# Patient Record
Sex: Male | Born: 1960 | Race: White | Hispanic: No | Marital: Married | Smoking: Former smoker
Health system: Southern US, Community
[De-identification: ages and names within clinical notes are randomized; demographics above are authoritative.]

## PROBLEM LIST (undated history)

## (undated) DIAGNOSIS — Z9289 Personal history of other medical treatment: Secondary | ICD-10-CM

## (undated) DIAGNOSIS — E785 Hyperlipidemia, unspecified: Secondary | ICD-10-CM

## (undated) DIAGNOSIS — E669 Obesity, unspecified: Secondary | ICD-10-CM

## (undated) DIAGNOSIS — I2510 Atherosclerotic heart disease of native coronary artery without angina pectoris: Secondary | ICD-10-CM

## (undated) DIAGNOSIS — K219 Gastro-esophageal reflux disease without esophagitis: Secondary | ICD-10-CM

## (undated) HISTORY — DX: Obesity, unspecified: E66.9

## (undated) HISTORY — DX: Hyperlipidemia, unspecified: E78.5

## (undated) HISTORY — DX: Atherosclerotic heart disease of native coronary artery without angina pectoris: I25.10

## (undated) HISTORY — DX: Personal history of other medical treatment: Z92.89

## (undated) HISTORY — DX: Gastro-esophageal reflux disease without esophagitis: K21.9

---

## 2007-09-18 HISTORY — PX: PERCUTANEOUS CORONARY STENT INTERVENTION (PCI-S): SHX6016

## 2007-10-04 ENCOUNTER — Inpatient Hospital Stay (HOSPITAL_COMMUNITY): Admission: EM | Admit: 2007-10-04 | Discharge: 2007-10-06 | Payer: Self-pay | Admitting: Emergency Medicine

## 2007-10-04 ENCOUNTER — Ambulatory Visit: Payer: Self-pay | Admitting: Cardiovascular Disease

## 2007-11-18 DIAGNOSIS — Z9289 Personal history of other medical treatment: Secondary | ICD-10-CM

## 2007-11-18 HISTORY — DX: Personal history of other medical treatment: Z92.89

## 2009-10-15 IMAGING — CR DG CHEST 1V PORT
1 series · 1 of 1 positions shown · non-contrast
Comparison: None

CLINICAL DATA: Chest pain

PORTABLE CHEST - 1 VIEW

[view not recorded]
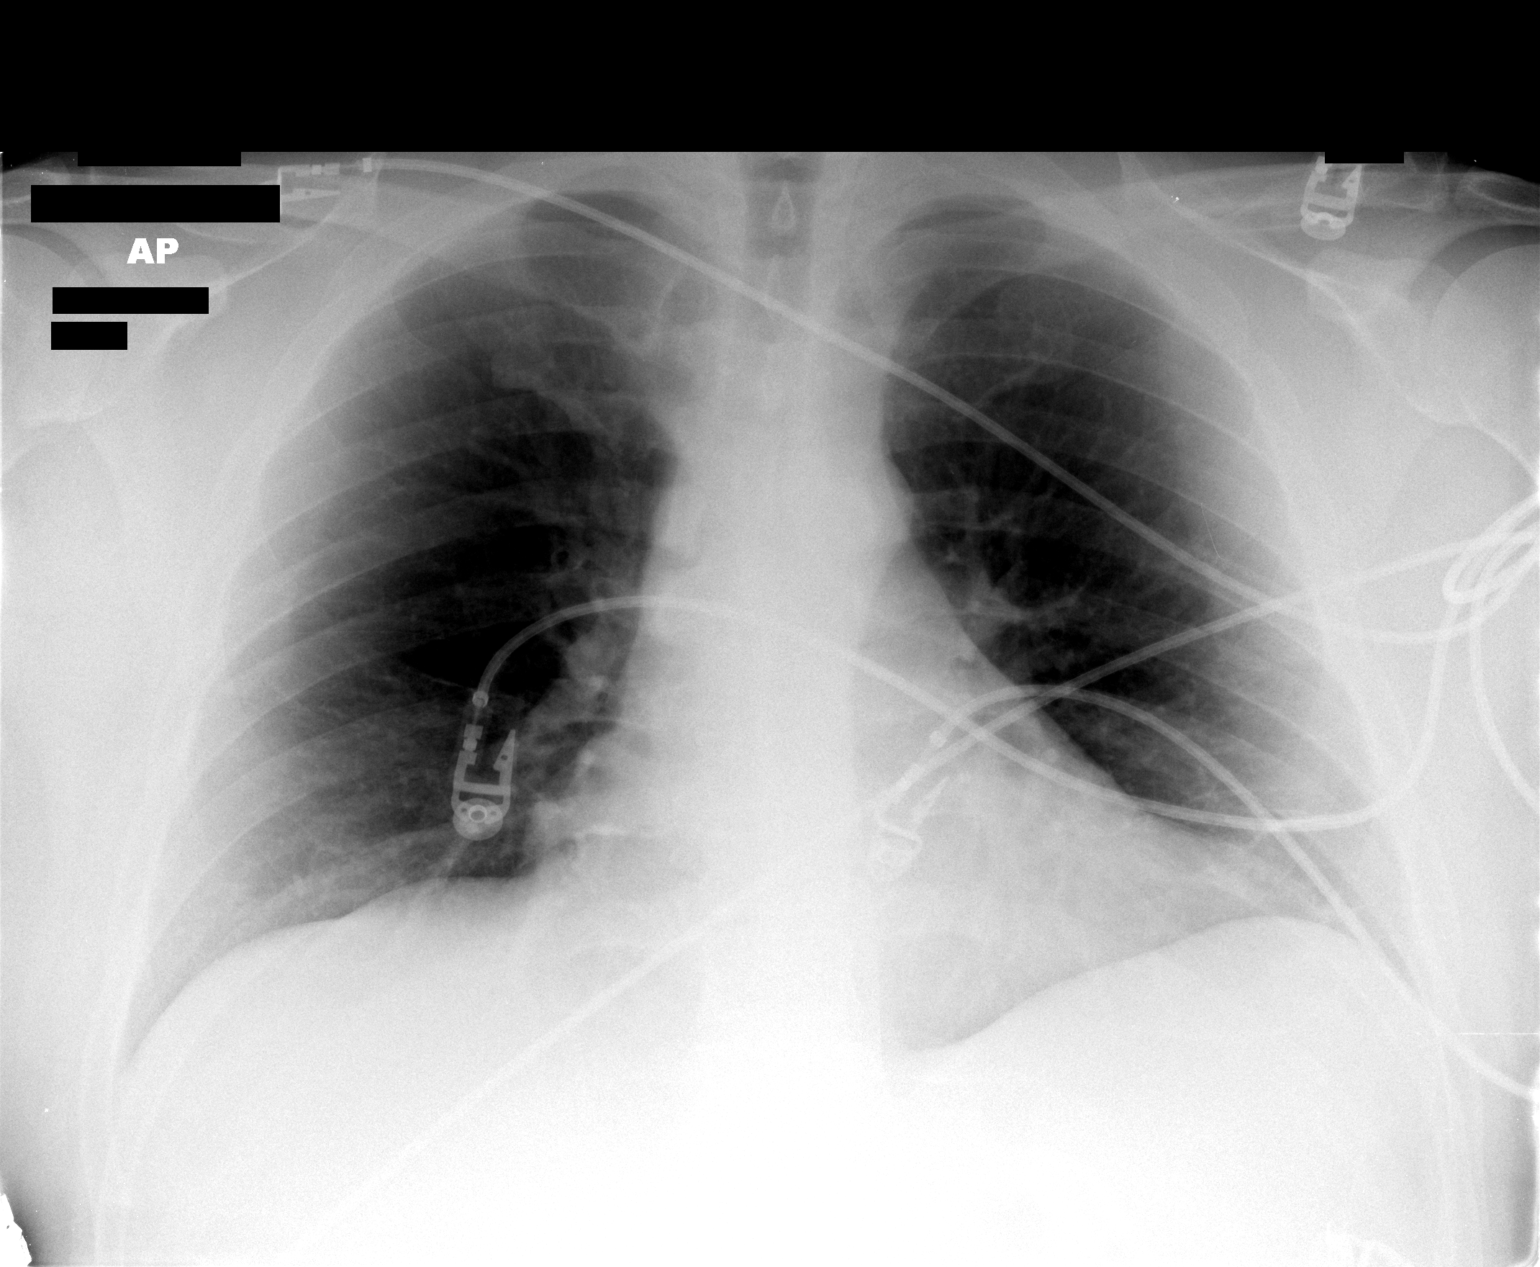

[1 of 1 positions shown; findings below may reference images not displayed]

FINDINGS: Cardiac leads overlie the chest.  Heart size is normal.
Lung volumes are low but clear.  No pleural effusion.  No acute
osseous finding.
IMPRESSION: Low lung volumes without focal cardiopulmonary process. If the
patient's symptoms continue, consider PA and lateral chest
radiographs obtained at full inspiration when the patient is
clinically able.

## 2010-06-01 NOTE — H&P (Signed)
NAME:  Joel Crawford, Joel Crawford NO.:  [id number]   MEDICAL RECORD NO.:  [id number]          PATIENT TYPE:  EMS   LOCATION:  [facility]JO                         FACILITY:  [facility]   PHYSICIAN:  Brayton El, MD    DATE OF BIRTH:  March 30, 1960   DATE OF ADMISSION:  10/04/2007  DATE OF DISCHARGE:                              HISTORY & PHYSICAL   CHIEF COMPLAINT:  Chest pain.   HISTORY OF PRESENT ILLNESS:  A 50 year old white male with a family  history significant for premature coronary disease presenting with a  recent history of stomach and chest discomfort.  The patient states that  for the past month or two, he has felt gurgling and discomfort in his  epigastric area that often radiates up the middle of his chest.  This  often occurs at night when he lies down and is associated with an acidic-  type taste in the back of his mouth.  For the past 2-3 days, he has also  experienced some chest tightness.  He states that this chest tightness  has usually been with exertion and quickly relieved with rest.  He came  in tonight because he experienced some worsening of his chest  discomfort, however, it was after eating a large meal.  Currently, the  patient is asymptomatic.   PAST MEDICAL HISTORY:  None.   FAMILY HISTORY:  Significant for premature coronary disease in that he  has brothers in their 74s who developed coronary disease.   SOCIAL HISTORY:  The patient does not use tobacco or use alcohol   ALLERGIES:  NO KNOWN DRUG ALLERGIES.   MEDICATIONS:  The patient does not take any medications.   REVIEW OF SYSTEMS:  As in HPI.  All other systems were reviewed and are  negative.   PHYSICAL EXAMINATION:  VITAL SIGNS:  Temperature of 99.1, pulse 111,  blood pressure 176/86, respirations 19, satting 99% on room air.  GENERAL:  He is in no acute distress.  HEENT:  Normocephalic, atraumatic.  NECK:  Supple without JVD or carotid bruit.  HEART:  Tachycardic with regular rhythm.  No  murmur, rub or gallop.  LUNGS:  Clear to auscultation bilaterally.  ABDOMEN:  There is some mild epigastric tenderness.  There is no  rebound.  It is soft.  EXTREMITIES:  Without edema.  SKIN:  Cool and dry.  NEURO:  Nonfocal.  PSYCHIATRIC:  The patient is appropriate with normal levels of insight.   LABORATORY DATA:  Sodium 141, potassium 3.4, chloride 108, CO2 24, BUN  18, creatinine 1.2, glucose 106, hemoglobin 17, hematocrit 50, CK-MB  1.9, troponin less than 0.05.   DIAGNOSTICS:  EKG shows a sinus tachycardia.  The patient has 1-2 mm ST  segment depression in the inferior leads.  The patient has approximately  1 mm ST segment depression in leads V2-V6.  There is no significant R-  wave or ST segment depression in lead V1.   ASSESSMENT:  A 50 year old white male with a family history of premature  coronary disease, presenting with a 2-3 day history of exertional chest  tightness that is  concerning for unstable angina.  The patient also  appears to be having symptoms that are consistent with gastroesophageal  reflux disease.  His initial set of cardiac enzymes is negative.  However, he does have some signs consistent with ischemia on his EKG.   PLAN:  He will be admitted and ruled out for myocardial infarction.  We  will start him on aspirin, beta-blocker, statin and anticoagulate him  with IV heparin.  We will be checking troponins every 4 hours.  We will  also start him on Protonix b.i.d.  He will be made n.p.o. after midnight  for a stress test versus left heart catheterization tomorrow.      Brayton El, MD  Electronically Signed     SGA/MEDQ  D:  10/04/2007  T:  10/04/2007  Job:  [id number]

## 2010-06-01 NOTE — Cardiovascular Report (Signed)
NAME:  Joel Crawford, Joel Crawford             ACCOUNT NO.:  [id number]   MEDICAL RECORD NO.:  [id number]          PATIENT TYPE:  INP   LOCATION:  6531                         FACILITY:  [facility]   PHYSICIAN:  Armanda Magic, M.D.     DATE OF BIRTH:  [birth year]   DATE OF PROCEDURE:  10/05/2007  DATE OF DISCHARGE:                            CARDIAC CATHETERIZATION   PROCEDURES:  1. Left heart catheterization.  2. Coronary angiography.  3. Left ventriculography.   OPERATOR:  Armanda Magic, M.D.   INDICATIONS:  Non-ST-elevation MI.   COMPLICATIONS:  None.   IV ACCESS:  Via right femoral artery 6-French sheath.   IV MEDICATIONS:  1. Versed 1 mg.  2. Fentanyl 25 mcg.  3. IV contrast 100 mL.   This is a 50 year old male who has no prior medical history who  presented with several days of chest pain brought on by eating.  He  presented to the emergency room yesterday with substernal chest pain and  ruled in for a non-ST-elevation MI.  He continued to have intermittent  chest pain and now presents for cardiac catheterization.   The patient was brought to the cardiac catheterization laboratory in the  fasting nonsedated state.  Informed consent was obtained.  The patient  was connected to continuous heart rate and pulse oximetry monitoring and  intermittent blood pressure monitoring.  The right groin was prepped and  draped in a sterile fashion.  Xylocaine 1% was used for local  anesthesia.  Using modified Seldinger technique, a 6-French sheath was  placed in the right femoral artery.  Under fluoroscopic guidance, a 6-  [place] JL-4 catheter was placed in the left coronary artery.  Multiple  cine films were taken at 30-degree RAO and 40-degree LAO views.  This  catheter was exchanged out over a guidewire for a 6-French JR-4 catheter  which was placed under fluoroscopic guidance of the right coronary  artery.  Multiple cine films were taken at 30-degree RAO and 40-degree  LAO views.  Catheter was  then exchanged out over a guidewire for 6-  French angled pigtail catheter which was placed under fluoroscopic  guidance in the left ventricular cavity.  Left ventriculography was  performed in the 30-degree RAO view using a total of 30 mL of contrast  at 15 mL per second.  A catheter was then pulled back across the aortic  valve with no significant gradient noted.  At the end of procedure, the  patient went on to PCI of the RCA by Dr. Verdis Prime.   RESULTS:  The left main coronary artery is widely patent and bifurcates  into the left anterior descending artery, and left circumflex artery.   Left circumflex artery is widely patent throughout its course in the AV  groove.  It gives rise to a first obtuse marginal branch which is widely  patent.   The  left anterior descending artery is widely patent as the proximal  portion gives rise to a first diagonal branch and a septal perforator.  Just distal to the takeoff of the septal perforator, there is an acute  bend in the  LAD with a 60-70% lesion, just distal to this lesion, there  is a second diagonal which is widely patent.  The ongoing LAD traverses  the apex and is widely patent.  There is evidence of left-to-right  collaterals filling the distal RCA.   The right coronary artery is patent in its proximal portion and then  there is a 99% stenosis in the midportion, just distal to that, there is  another sequential 90% stenosis before the bifurcation of posterior  descending artery and posterior lateral artery.  There appears to be  possibly some diffuse spasm at the bifurcation of the posterior  descending artery and posterior lateral artery.   Left ventriculography showed normal LV function, overall EF of 55% with  inferior basal akinesis, LV pressure 130/30 mmHg, aortic pressure 134/82  mmHg, LVEDP was 27 mmHg.   ASSESSMENT:  1. 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  2. Overall normal left ventricular  function with inferior basal      akinesis.  3. Status post non-ST-elevation myocardial infarction.   PLAN:  PCI of the RCA, aspirin and Plavix, start Zocor 40 mg daily.  Check a fasting statin panel and hemoglobin A1c in the morning.  Start  Toprol-XL 25 mg a day.  Start Altace 2.5 mg a day.  Will plan stress  Cardiolite study in 1-2 months to evaluate for LAD ischemia.      Armanda Magic, M.D.  Electronically Signed     TT/MEDQ  D:  10/05/2007  T:  10/06/2007  Job:  [id number]

## 2010-06-01 NOTE — Cardiovascular Report (Signed)
NAME:  Joel Crawford, SIRMON NO.:  [id number]   MEDICAL RECORD NO.:  [id number]          PATIENT TYPE:  INP   LOCATION:  6531                         FACILITY:  [facility]   PHYSICIAN:  Lyn Records, M.D.   DATE OF BIRTH:  14-Apr-1960   DATE OF PROCEDURE:  10/05/2007  DATE OF DISCHARGE:                            CARDIAC CATHETERIZATION   INDICATIONS FOR PROCEDURE:  Acute coronary syndrome with positive  troponins and ischemic abnormalities on EKG.   PROCEDURE PERFORMED:  Drug-eluting stent implantation mid and distal  right coronary.  This is a prolonged complicated procedure due to vessel  tortuosity requiring double wire/buddy wire technique.   DESCRIPTION:  After reviewing the images with Dr. Mayford Knife, we decided to  proceed with PCI on the right coronary.  Right coronary was subtotally  occluded with tandem greater than 95% lesions in the mid-to-distal  vessel.  The vessels were very tortuous with other regions of moderate  stenosis proximally up to 70% and also distally at the bifurcation with  the PDA and continuation of the right coronary.  Because it was not a  high-grade disease in the circumflex and the LAD, we felt that PCI of  the culprit lesions was the most prudent approach in this young  gentleman.   We initially attempted to use a shepherd's crook and hockey-stick  coronary guide system, but they did not provide adequate support.  We  then placed a side-hole #1, 6-French left Judkins catheter.  This  catheter was intubated at the right coronary without very much trauma  and provided excellent support.  We decided to use Integrilin since the  patient already received IV heparin as an anticoagulation mix.  We  provided heparin at 50 units/kg and Integrilin weight-based double bolus  followed by an infusion.  ACT was documented to be greater than 300  seconds.   We then proceeded with PCI.  PCI was complicated by the severe  tortuosity in the right  coronary, proximal mid, and distal segments.  We  were eventually able to get a BMW wire down through the proximal  tortuosity, the culprit lesions, and distally.  We then used a 15-mm  long Apex 3.0 balloon.  Predilatation was performed with this balloon.  We also used a 3.5 x 12 mm Apex balloon to perform angioplasty on the  more distal of the tandem distal lesions.  We then placed an [employer] guidewire as a buddy into the distal right coronary with some  difficulty but eventual success.  We then were able to distally placed a  28-mm long x 3.5-mm diameter PROMUS stent.  The stent was deployed at 15  atmospheres.  We post-dilated with a 4.0 x 20 mm long Voyager balloon to  peak pressure of 15 atmospheres.  The patient tolerated the procedure  without difficulty.  The wires down the vessel caused severe  pseudostenosis in the proximal and mid segment.  Retracting the BMW wire  to the soft tip allowed to the tortuosity to recoil and we felt that the  residual lesions left were less than severely critical and the  case was  terminated.  Followup angiography was performed.  TIMI grade 3 flow was  noted.  The distal 28-mm long PROMUS stent was well placed.  No edge  dissections were noted.  No thrombus was noted in the vessel.   CONCLUSION:  Successful complicated PCI of the mid/distal RCA with  reduction in 99% tandem stenoses to 0% with restoration of TIMI grade 3  flow.  The patient is left with residual 50-60% segmental stenosis in  the proximal vessel eccentric relatively focal 60-70% stenosis in the  mid vessel and 70% stenosis proximal to the bifurcation of the right  coronary into the PDA and continuation of the right coronary.   PLAN:  Aggressive risk factor modification.  Aspirin and Plavix for a  year.  Eventual stress testing with myocardial perfusion imaging to  evaluate for evidence of anterior and/or inferior ischemia with further  management per Dr. Armanda Magic.  It  would be possible at some point in  the future to do further stenting in the right coronary.  The more  distal lesion at the bifurcation will be somewhat complex or probably  durable as well.      Lyn Records, M.D.  Electronically Signed     HWS/MEDQ  D:  10/05/2007  T:  10/06/2007  Job:  [id number]

## 2010-06-04 NOTE — Discharge Summary (Signed)
NAME:  Joel Crawford, Joel Crawford             ACCOUNT NO.:  [id number]   MEDICAL RECORD NO.:  [id number]          PATIENT TYPE:  INP   LOCATION:  6531                         FACILITY:  [facility]   PHYSICIAN:  Armanda Magic, M.D.     DATE OF BIRTH:  February 27, 1960   DATE OF ADMISSION:  10/04/2007  DATE OF DISCHARGE:  10/06/2007                               DISCHARGE SUMMARY   DISCHARGE DIAGNOSES:  1. Chest pain, resolved.  2. Coronary artery disease, status post drug-eluting stent to the      right coronary artery.  3. Gastroesophageal reflux disease.   HOSPITAL COURSE:  [name] is a 51 year old patient who was  admitted to [facility] on October 04, 2007 with substernal chest  pain that had been intermittent over the past 2-3 days.  He did rule in  for a non-ST segment elevated myocardial infarction with a troponin of  0.77.   Other lab work included a TSH of 2.420, hemoglobin 14.9, hematocrit  43.2, platelets 179, white count 7.9.  Total cholesterol 150,  triglyceride 59, LDL 113, HDL 23.  Hemoglobin A1c 4.9, magnesium 2.1.   Because of his symptoms and the fact he ruled in for myocardial  infarction, he underwent cardiac catheterization.  He was found to have  a mid 70% circumflex lesion, but had a subtotal right coronary artery.  Dr. Katrinka Blazing then proceeded with drug-eluting stent implantation to the  right coronary artery without trouble.  The patient then stayed in the  hospital overnight, was ready for discharge to home on the following  day.   DISCHARGE MEDICATIONS:  1. Toprol-XL 25 mg one p.o. daily.  2. Altace 2.5 mg one p.o. daily.  3. Aspirin 325 mg one p.o. daily.  4. Plavix 75 mg one p.o. daily.  5. Zocor 40 mg one p.o. daily.   The patient may return to work on Monday. __________ today.  The patient  is to see Dr. Mayford Knife in 2 weeks and is to call for that appointment.     Guy Franco, P.A.      Armanda Magic, M.D.  Electronically Signed   LB/MEDQ  D:   11/14/2007  T:  11/15/2007  Job:  [id number]   cc:   Armanda Magic, M.D.

## 2010-10-18 LAB — PROTIME-INR
INR: 1
Prothrombin Time: 15.3 — ABNORMAL HIGH

## 2010-10-18 LAB — CBC
HCT: 43
HCT: 43.2
HCT: 44.7
Hemoglobin: 14.7
Hemoglobin: 14.9
MCHC: 34.6
MCV: 93.4
MCV: 93.7
MCV: 93.8
MCV: 93.9
Platelets: 207
RBC: 4.6
RBC: 4.78
RBC: 5.18
RDW: 12.5
WBC: 8.4
WBC: 9.3
WBC: 9.7

## 2010-10-18 LAB — POCT I-STAT, CHEM 8
Chloride: 108
HCT: 50
Potassium: 3.4 — ABNORMAL LOW

## 2010-10-18 LAB — APTT
aPTT: 28
aPTT: 67 — ABNORMAL HIGH

## 2010-10-18 LAB — CARDIAC PANEL(CRET KIN+CKTOT+MB+TROPI)
CK, MB: 4.9 — ABNORMAL HIGH
Relative Index: 3.8 — ABNORMAL HIGH
Troponin I: 0.77

## 2010-10-18 LAB — DIFFERENTIAL
Eosinophils Absolute: 0.1
Lymphs Abs: 1.3
Monocytes Relative: 11
Neutrophils Relative %: 74

## 2010-10-18 LAB — BASIC METABOLIC PANEL
CO2: 26
Chloride: 107
Glucose, Bld: 95
Potassium: 4
Sodium: 139

## 2010-10-18 LAB — LIPID PANEL
Cholesterol: 184
HDL: 26 — ABNORMAL LOW
Total CHOL/HDL Ratio: 7.1
Triglycerides: 46
Triglycerides: 69
VLDL: 14

## 2010-10-18 LAB — COMPREHENSIVE METABOLIC PANEL
ALT: 59 — ABNORMAL HIGH
Albumin: 3.9
Alkaline Phosphatase: 50
Potassium: 3.7
Sodium: 140
Total Protein: 6.6

## 2010-10-18 LAB — POCT CARDIAC MARKERS
CKMB, poc: 1.9
Myoglobin, poc: 84.5
Troponin i, poc: <0.05

## 2010-10-18 LAB — CK TOTAL AND CKMB (NOT AT ARMC): Total CK: 144

## 2010-10-18 LAB — HEPATIC FUNCTION PANEL
ALT: 55 — ABNORMAL HIGH
Total Protein: 6

## 2012-10-24 ENCOUNTER — Ambulatory Visit: Payer: Self-pay | Admitting: Cardiology

## 2012-11-22 ENCOUNTER — Ambulatory Visit: Payer: Self-pay | Admitting: Cardiology

## 2012-11-29 ENCOUNTER — Other Ambulatory Visit: Payer: Self-pay | Admitting: Cardiology

## 2012-12-17 ENCOUNTER — Ambulatory Visit: Payer: Self-pay | Admitting: Cardiology

## 2013-01-01 ENCOUNTER — Other Ambulatory Visit: Payer: Self-pay | Admitting: Cardiology

## 2013-01-26 ENCOUNTER — Other Ambulatory Visit: Payer: Self-pay | Admitting: Cardiology

## 2013-01-29 ENCOUNTER — Other Ambulatory Visit: Payer: Self-pay | Admitting: Cardiology

## 2013-03-07 ENCOUNTER — Telehealth: Payer: Self-pay | Admitting: *Deleted

## 2013-03-07 MED ORDER — CLOPIDOGREL BISULFATE 75 MG PO TABS: 75.0000 mg | ORAL_TABLET | Freq: Once | ORAL | Status: AC

## 2013-03-07 NOTE — Telephone Encounter (Signed)
Pharmacy requests plavix refill. I made her aware that the patient needs appointment and that I would send a message to you as the patient has not been seen in the new office. She stated that she would call the patient to let him know that he needs an appointment. Thanks, MI

## 2013-03-07 NOTE — Telephone Encounter (Signed)
Called pt and scheduled an appt with Dr Mayford Knifeurner. Gave enough plavix to get pt thru until seen by Dr Mayford Knifeurner

## 2013-03-19 ENCOUNTER — Other Ambulatory Visit: Payer: Self-pay | Admitting: Cardiology

## 2013-03-19 MED ORDER — RAMIPRIL 10 MG PO CAPS: 10.0000 mg | ORAL_CAPSULE | Freq: Every day | ORAL | Status: AC

## 2013-03-27 ENCOUNTER — Other Ambulatory Visit: Payer: Self-pay | Admitting: Cardiology

## 2013-04-01 ENCOUNTER — Encounter: Payer: Self-pay | Admitting: General Surgery

## 2013-04-01 DIAGNOSIS — Z79899 Other long term (current) drug therapy: Secondary | ICD-10-CM | POA: Insufficient documentation

## 2013-04-01 DIAGNOSIS — I10 Essential (primary) hypertension: Secondary | ICD-10-CM | POA: Insufficient documentation

## 2013-04-05 ENCOUNTER — Ambulatory Visit (INDEPENDENT_AMBULATORY_CARE_PROVIDER_SITE_OTHER): Payer: BC Managed Care – PPO | Admitting: Cardiology

## 2013-04-05 ENCOUNTER — Encounter: Payer: Self-pay | Admitting: Cardiology

## 2013-04-05 VITALS — BP 179/94 | HR 85 | Ht 73.0 in | Wt 267.8 lb

## 2013-04-05 DIAGNOSIS — I2510 Atherosclerotic heart disease of native coronary artery without angina pectoris: Secondary | ICD-10-CM

## 2013-04-05 DIAGNOSIS — E785 Hyperlipidemia, unspecified: Secondary | ICD-10-CM

## 2013-04-05 DIAGNOSIS — I10 Essential (primary) hypertension: Secondary | ICD-10-CM

## 2013-04-05 MED ORDER — METOPROLOL SUCCINATE ER 50 MG PO TB24: 50.0000 mg | ORAL_TABLET | Freq: Every day | ORAL | Status: AC

## 2013-04-05 NOTE — Progress Notes (Signed)
  [address] [place], [place]  [address] Phone: [phone] Fax:  [phone]  Date:  04/05/2013   ID:  Joel NeedsKenneth A Messimer, DOB [birth year], MRN [id number]  PCP:  No primary provider on file.  Cardiologist:  Armanda Magicraci Turner, MD    History of Present Illness: Joel Crawford is a 53 y.o. male with a history of ASCAD, HTN and dyslipidemia who presents today for followup.  He is doing well.  He denies any chest pain, SOB, DOE, LE edema, dizziness, palpitations or syncope.  He says at home his BP has been running high with SBP in the 150's.     Wt Readings from Last 3 Encounters:  04/05/13 267 lb 12.8 oz (121.473 kg)     Past Medical History  Diagnosis Date  . H/O exercise stress test 11/2007    Normal LVF  w inferior basal akinesis  . Dyslipidemia   . Obesity   . GERD (gastroesophageal reflux disease)   . Coronary artery disease     s/p non-ST elevation MI w PCI of the mid and distalRCA. He has residual disease of 60-7-% in the mid LAD    Current Outpatient Prescriptions  Medication Sig Dispense Refill  . aspirin EC 81 MG tablet Take 81 mg by mouth daily.      . clopidogrel (PLAVIX) 75 MG tablet Take 1 tablet (75 mg total) by mouth once.  30 tablet  2  . CRESTOR 40 MG tablet TAKE 1 TABLET EVERY DAY  30 tablet  0  . metoprolol succinate (TOPROL-XL) 25 MG 24 hr tablet TAKE 1 TABLET BY MOUTH EVERY DAY  90 tablet  0  . ramipril (ALTACE) 10 MG capsule Take 1 capsule (10 mg total) by mouth daily.  30 capsule  0   No current facility-administered medications for this visit.    Allergies:   No Known Allergies  Social History:  The patient  reports that he quit smoking about 25 years ago. He does not have any smokeless tobacco history on file. He reports that he drinks alcohol. He reports that he does not use illicit drugs.   Family History:  The patient's family history includes CAD in his brother and brother; Hypertension in his brother and mother.   ROS:  Please see  the history of present illness.      All other systems reviewed and negative.   PHYSICAL EXAM: VS:  BP 179/94  Pulse 85  Ht 6\' 1"  (1.854 m)  Wt 267 lb 12.8 oz (121.473 kg)  BMI 35.34 kg/m2 Well nourished, well developed, in no acute distress HEENT: normal Neck: no JVD Cardiac:  normal S1, S2; RRR; no murmur Lungs:  clear to auscultation bilaterally, no wheezing, rhonchi or rales Abd: soft, nontender, no hepatomegaly Ext: no edema Skin: warm and dry Neuro:  CNs 2-12 intact, no focal abnormalities noted  EKG:   NSR    ASSESSMENT AND PLAN:  1. ASCAD with no angina - continue ASA/Plavix 2. HTN with elevated BP - continue Toprol/ramipril - increase Toprol to 50mg  daily - Check BMET  - check BP daily for a week and call with the results 3. Dyslipidemia - check fasting lipids and ALT - continue Crestor  Signed, Armanda Magicraci Turner, MD 04/05/2013 8:39 AM

## 2013-04-05 NOTE — Patient Instructions (Signed)
Your physician has recommended you make the following change in your medication: 1. Increase Toprol XL to 50 MG 1 tablet daily  Your physician has requested that you regularly monitor and record your blood pressure readings at home. Please use the same machine at the same time of day to check your readings and record them for a week and call us with the results.   Your physician recommends that you return for lab work Next Friday 03/15/12 for a BMET and LIPID/ALT  Your physician wants you to follow-up in: 1 year with Dr Sherlyn Lickurner You will receive a reminder letter in the mail two months in advance. If you don't receive a letter, please call our office to schedule the follow-up appointment.

## 2013-04-12 ENCOUNTER — Other Ambulatory Visit: Payer: BC Managed Care – PPO

## 2014-01-24 ENCOUNTER — Encounter: Payer: Self-pay | Admitting: Cardiology

## 2014-03-25 ENCOUNTER — Encounter: Payer: Self-pay | Admitting: Cardiology
